# Patient Record
Sex: Female | Born: 1990 | Race: White | Hispanic: No | Marital: Single | Smoking: Current every day smoker
Health system: Southern US, Community
[De-identification: ages and names within clinical notes are randomized; demographics above are authoritative.]

## PROBLEM LIST (undated history)

## (undated) DIAGNOSIS — F17200 Nicotine dependence, unspecified, uncomplicated: Secondary | ICD-10-CM

## (undated) HISTORY — DX: Nicotine dependence, unspecified, uncomplicated: F17.200

---
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
## 2011-02-16 LAB — I-STAT 8, (EC8 V) (CONVERTED LAB)
Acid-base deficit: 1
BUN: 12
Bicarbonate: 24.8 — ABNORMAL HIGH
Chloride: 107
Glucose, Bld: 83
HCT: 41
Hemoglobin: 13.9
Operator id: 270651
Potassium: 3.8
Sodium: 141
TCO2: 26
pCO2, Ven: 43.8 — ABNORMAL LOW
pH, Ven: 7.361 — ABNORMAL HIGH

## 2011-02-16 LAB — CBC
HCT: 37.8
Hemoglobin: 13
MCHC: 34.4
MCV: 86.9
Platelets: 242
RBC: 4.35
RDW: 12.5
WBC: 5.7

## 2011-02-16 LAB — URINALYSIS, ROUTINE W REFLEX MICROSCOPIC
Bilirubin Urine: NEGATIVE
Glucose, UA: NEGATIVE
Hgb urine dipstick: NEGATIVE
Ketones, ur: NEGATIVE
Leukocytes, UA: NEGATIVE
Nitrite: NEGATIVE
Protein, ur: NEGATIVE
Specific Gravity, Urine: 1.028
Urobilinogen, UA: 1
pH: 7.5

## 2011-02-16 LAB — DIFFERENTIAL
Basophils Absolute: 0.1
Basophils Relative: 1
Eosinophils Absolute: 0.1
Eosinophils Relative: 3
Lymphocytes Relative: 39
Lymphs Abs: 2.2
Monocytes Absolute: 0.7
Monocytes Relative: 11 — ABNORMAL HIGH
Neutro Abs: 2.6
Neutrophils Relative %: 46

## 2011-02-16 LAB — URINE MICROSCOPIC-ADD ON

## 2011-02-16 LAB — POCT PREGNANCY, URINE
Operator id: 133351
Preg Test, Ur: NEGATIVE

## 2011-02-16 LAB — POCT I-STAT CREATININE
Creatinine, Ser: 0.7
Operator id: 270651

## 2011-02-25 ENCOUNTER — Ambulatory Visit (INDEPENDENT_AMBULATORY_CARE_PROVIDER_SITE_OTHER): Payer: Managed Care, Other (non HMO) | Admitting: Women's Health

## 2011-02-25 ENCOUNTER — Encounter: Payer: Self-pay | Admitting: Women's Health

## 2011-02-25 VITALS — BP 100/60 | Ht 64.5 in | Wt 112.0 lb

## 2011-02-25 DIAGNOSIS — Z23 Encounter for immunization: Secondary | ICD-10-CM

## 2011-02-25 DIAGNOSIS — F17200 Nicotine dependence, unspecified, uncomplicated: Secondary | ICD-10-CM

## 2011-02-25 DIAGNOSIS — Z01419 Encounter for gynecological examination (general) (routine) without abnormal findings: Secondary | ICD-10-CM

## 2011-02-25 DIAGNOSIS — Z309 Encounter for contraceptive management, unspecified: Secondary | ICD-10-CM

## 2011-02-25 DIAGNOSIS — IMO0001 Reserved for inherently not codable concepts without codable children: Secondary | ICD-10-CM

## 2011-02-25 DIAGNOSIS — D249 Benign neoplasm of unspecified breast: Secondary | ICD-10-CM

## 2011-02-25 DIAGNOSIS — Z113 Encounter for screening for infections with a predominantly sexual mode of transmission: Secondary | ICD-10-CM

## 2011-02-25 HISTORY — DX: Nicotine dependence, unspecified, uncomplicated: F17.200

## 2011-02-25 MED ORDER — NORETHIN ACE-ETH ESTRAD-FE 1-20 MG-MCG PO TABS: 1.0000 | ORAL_TABLET | Freq: Every day | ORAL | Status: DC

## 2011-02-25 NOTE — Patient Instructions (Signed)
Come back for gardasil in December and April ( For 2nd and 3rd)  QUIT smoking!!!!  Take out tongue ring

## 2011-02-25 NOTE — Progress Notes (Signed)
Carolyn Eaton [birth year] [id number]    History:    The patient presents for annual exam.    Past medical history, past surgical history, family history and social history were all reviewed and documented in the EPIC chart.   ROS:  A  ROS was performed and pertinent positives and negatives are included in the history.  Exam:  Filed Vitals:   02/25/11 1528  BP: 100/60    General appearance:  Normal Head/Neck:  Normal, without cervical or supraclavicular adenopathy. Thyroid:  Symmetrical, normal in size, without palpable masses or nodularity. Respiratory  Effort:  Normal  Auscultation:  Clear without wheezing or rhonchi Cardiovascular  Auscultation:  Regular rate, without rubs, murmurs or gallops  Edema/varicosities:  Not grossly evident Abdominal  Soft,nontender, without masses, guarding or rebound.  Liver/spleen:  No organomegaly noted  Hernia:  None appreciated  Skin  Inspection:  Grossly normal  Palpation:  Grossly normal Neurologic/psychiatric  Orientation:  Normal with appropriate conversation.  Mood/affect:  Normal  Genitourinary    Breasts: Examined lying and sitting.     Right: Without masses, retractions, discharge or axillary adenopathy.     Left: Without masses, retractions, discharge or axillary adenopathy.   Inguinal/mons:  Normal without inguinal adenopathy  External genitalia:  Normal  BUS/Urethra/Skene's glands:  Normal  Bladder:  Normal  Vagina:  Normal  Cervix:  Normal  Uterus:   normal in size, shape and contour.  Midline and mobile  Adnexa/parametria:     Rt: Without masses or tenderness.   Lt: Without masses or tenderness.  Anus and perineum: Normal  Digital rectal exam:   Assessment/Plan:  20 y.o.SWF G0  for annual exam monthly 5-6 day cycle. Not sexually active, used condoms and OCPs in past when active. Gardasil information was reviewed first given today. Is aware it is a 3 series vaccine. History of a fibroadenoma, had an ultrasound in  09 did not have followup will get  scheduled.  Normal GYN exam STD screen Smoker one pack per day  Plan: Contraception options were reviewed, Loestrin 1/20, prescription proper use, start up, condoms especially first month and for infection control were reviewed if becomes sexually active. Did review slight risk for blood clots, strokes.  Reviewed importance of no smoking. SBEs, exercise, calcium rich diet, MVI daily encouraged. CBC, UA, GC/Chlamydia, HIV, RPR, hepatitis B and C. Return to office in 2 and 6 months for second and third Gardasil.    Harrington Challenger [facility], 4:52 PM 02/25/2011

## 2011-02-26 LAB — HEPATITIS B SURFACE ANTIGEN: Hepatitis B Surface Ag: NEGATIVE

## 2011-02-26 LAB — HIV ANTIBODY (ROUTINE TESTING W REFLEX): HIV: NONREACTIVE

## 2011-02-26 LAB — RPR

## 2011-02-26 LAB — HEPATITIS C ANTIBODY: HCV Ab: NEGATIVE

## 2011-03-08 ENCOUNTER — Ambulatory Visit
Admission: RE | Admit: 2011-03-08 | Discharge: 2011-03-08 | Disposition: A | Discharge status: Home / Self Care | Payer: Managed Care, Other (non HMO) | Source: Ambulatory Visit | Attending: Women's Health | Admitting: Women's Health

## 2011-03-08 DIAGNOSIS — D249 Benign neoplasm of unspecified breast: Secondary | ICD-10-CM

## 2011-03-10 ENCOUNTER — Telehealth: Payer: Self-pay | Admitting: Women's Health

## 2011-03-10 NOTE — Telephone Encounter (Signed)
Telephone [place] to review ultrasound of left breast. History of fibroadenomas, they are stable, will continue SBEs and report changes.

## 2012-03-06 ENCOUNTER — Other Ambulatory Visit: Payer: Self-pay | Admitting: *Deleted

## 2012-03-06 ENCOUNTER — Encounter: Payer: Self-pay | Admitting: *Deleted

## 2012-03-06 DIAGNOSIS — IMO0001 Reserved for inherently not codable concepts without codable children: Secondary | ICD-10-CM

## 2012-03-06 MED ORDER — NORETHIN ACE-ETH ESTRAD-FE 1-20 MG-MCG PO TABS: 1.0000 | ORAL_TABLET | Freq: Every day | ORAL | Status: DC

## 2012-03-06 NOTE — Progress Notes (Signed)
Patient ID: Carolyn Eaton, female   DOB: 1991-02-16, 21 y.o.   MRN: [id number] Pt called requesting refill on her birth control pills, pt now lives in [place]. I told pt 2 pack can be sent, but she will need to find md in [place]. Pt gave # to [facility] ([phone]. Left message on pt voicemail that this # is not working.

## 2012-04-24 ENCOUNTER — Encounter: Payer: Self-pay | Admitting: Gynecology

## 2012-04-24 ENCOUNTER — Ambulatory Visit (INDEPENDENT_AMBULATORY_CARE_PROVIDER_SITE_OTHER): Payer: Managed Care, Other (non HMO) | Admitting: Gynecology

## 2012-04-24 ENCOUNTER — Other Ambulatory Visit (HOSPITAL_COMMUNITY)
Admission: RE | Admit: 2012-04-24 | Discharge: 2012-04-24 | Disposition: A | Discharge status: Home / Self Care | Payer: Managed Care, Other (non HMO) | Source: Ambulatory Visit | Attending: Gynecology | Admitting: Gynecology

## 2012-04-24 VITALS — BP 120/70 | Ht 64.5 in | Wt 118.0 lb

## 2012-04-24 DIAGNOSIS — Z113 Encounter for screening for infections with a predominantly sexual mode of transmission: Secondary | ICD-10-CM

## 2012-04-24 DIAGNOSIS — Z01419 Encounter for gynecological examination (general) (routine) without abnormal findings: Secondary | ICD-10-CM | POA: Insufficient documentation

## 2012-04-24 DIAGNOSIS — Z1151 Encounter for screening for human papillomavirus (HPV): Secondary | ICD-10-CM | POA: Insufficient documentation

## 2012-04-24 DIAGNOSIS — R87810 Cervical high risk human papillomavirus (HPV) DNA test positive: Secondary | ICD-10-CM | POA: Insufficient documentation

## 2012-04-24 DIAGNOSIS — Z23 Encounter for immunization: Secondary | ICD-10-CM

## 2012-04-24 DIAGNOSIS — IMO0001 Reserved for inherently not codable concepts without codable children: Secondary | ICD-10-CM

## 2012-04-24 DIAGNOSIS — Z309 Encounter for contraceptive management, unspecified: Secondary | ICD-10-CM

## 2012-04-24 LAB — CBC WITH DIFFERENTIAL/PLATELET
Basophils Absolute: 0.1 10*3/uL (ref 0.0–0.1)
Basophils Relative: 1 % (ref 0–1)
Eosinophils Absolute: 0.2 10*3/uL (ref 0.0–0.7)
Eosinophils Relative: 2 % (ref 0–5)
HCT: 44.1 % (ref 36.0–46.0)
Hemoglobin: 14.9 g/dL (ref 12.0–15.0)
Lymphocytes Relative: 38 % (ref 12–46)
Lymphs Abs: 3 10*3/uL (ref 0.7–4.0)
MCH: 30.3 pg (ref 26.0–34.0)
MCHC: 33.8 g/dL (ref 30.0–36.0)
MCV: 89.8 fL (ref 78.0–100.0)
Monocytes Absolute: 0.6 10*3/uL (ref 0.1–1.0)
Monocytes Relative: 7 % (ref 3–12)
Neutro Abs: 4.2 10*3/uL (ref 1.7–7.7)
Neutrophils Relative %: 52 % (ref 43–77)
Platelets: 315 10*3/uL (ref 150–400)
RBC: 4.91 MIL/uL (ref 3.87–5.11)
RDW: 13.3 % (ref 11.5–15.5)
WBC: 8 10*3/uL (ref 4.0–10.5)

## 2012-04-24 MED ORDER — NORETHIN ACE-ETH ESTRAD-FE 1-20 MG-MCG PO TABS: 1.0000 | ORAL_TABLET | Freq: Every day | ORAL | Status: DC

## 2012-04-24 NOTE — Patient Instructions (Addendum)
Smoking Cessation Quitting smoking is important to your health and has many advantages. However, it is not always easy to quit since nicotine is a very addictive drug. Often times, people try 3 times or more before being able to quit. This document explains the best ways for you to prepare to quit smoking. Quitting takes hard work and a lot of effort, but you can do it. ADVANTAGES OF QUITTING SMOKING  You will live longer, feel better, and live better.  Your body will feel the impact of quitting smoking almost immediately.  Within 20 minutes, blood pressure decreases. Your pulse returns to its normal level.  After 8 hours, carbon monoxide levels in the blood return to normal. Your oxygen level increases.  After 24 hours, the chance of having a heart attack starts to decrease. Your breath, hair, and body stop smelling like smoke.  After 48 hours, damaged nerve endings begin to recover. Your sense of taste and smell improve.  After 72 hours, the body is virtually free of nicotine. Your bronchial tubes relax and breathing becomes easier.  After 2 to 12 weeks, lungs can hold more air. Exercise becomes easier and circulation improves.  The risk of having a heart attack, stroke, cancer, or lung disease is greatly reduced.  After 1 year, the risk of coronary heart disease is cut in half.  After 5 years, the risk of stroke falls to the same as a nonsmoker.  After 10 years, the risk of lung cancer is cut in half and the risk of other cancers decreases significantly.  After 15 years, the risk of coronary heart disease drops, usually to the level of a nonsmoker.  If you are pregnant, quitting smoking will improve your chances of having a healthy baby.  The people you live with, especially any children, will be healthier.  You will have extra money to spend on things other than cigarettes. QUESTIONS TO THINK ABOUT BEFORE ATTEMPTING TO QUIT You may want to talk about your answers with your  caregiver.  Why do you want to quit?  If you tried to quit in the past, what helped and what did not?  What will be the most difficult situations for you after you quit? How will you plan to handle them?  Who can help you through the tough times? Your family? Friends? A caregiver?  What pleasures do you get from smoking? What ways can you still get pleasure if you quit? Here are some questions to ask your caregiver:  How can you help me to be successful at quitting?  What medicine do you think would be best for me and how should I take it?  What should I do if I need more help?  What is smoking withdrawal like? How can I get information on withdrawal? GET READY  Set a quit date.  Change your environment by getting rid of all cigarettes, ashtrays, matches, and lighters in your home, car, or work. Do not let people smoke in your home.  Review your past attempts to quit. Think about what worked and what did not. GET SUPPORT AND ENCOURAGEMENT You have a better chance of being successful if you have help. You can get support in many ways.  Tell your family, friends, and co-workers that you are going to quit and need their support. Ask them not to smoke around you.  Get individual, group, or telephone counseling and support. Programs are available at local hospitals and health centers. [place] your local health department for   information about programs in your area.  Spiritual beliefs and practices may help some smokers quit.  Download a "quit meter" on your computer to keep track of quit statistics, such as how long you have gone without smoking, cigarettes not smoked, and money saved.  Get a self-help book about quitting smoking and staying off of tobacco. LEARN NEW SKILLS AND BEHAVIORS  Distract yourself from urges to smoke. Talk to someone, go for a walk, or occupy your time with a task.  Change your normal routine. Take a different route to work. Drink tea instead of coffee.  Eat breakfast in a different place.  Reduce your stress. Take a hot bath, exercise, or read a book.  Plan something enjoyable to do every day. Reward yourself for not smoking.  Explore interactive web-based programs that specialize in helping you quit. GET MEDICINE AND USE IT CORRECTLY Medicines can help you stop smoking and decrease the urge to smoke. Combining medicine with the above behavioral methods and support can greatly increase your chances of successfully quitting smoking.  Nicotine replacement therapy helps deliver nicotine to your body without the negative effects and risks of smoking. Nicotine replacement therapy includes nicotine gum, lozenges, inhalers, nasal sprays, and skin patches. Some may be available over-the-counter and others require a prescription.  Antidepressant medicine helps people abstain from smoking, but how this works is unknown. This medicine is available by prescription.  Nicotinic receptor partial agonist medicine simulates the effect of nicotine in your brain. This medicine is available by prescription. Ask your caregiver for advice about which medicines to use and how to use them based on your health history. Your caregiver will tell you what side effects to look out for if you choose to be on a medicine or therapy. Carefully read the information on the package. Do not use any other product containing nicotine while using a nicotine replacement product.  RELAPSE OR DIFFICULT SITUATIONS Most relapses occur within the first 3 months after quitting. Do not be discouraged if you start smoking again. Remember, most people try several times before finally quitting. You may have symptoms of withdrawal because your body is used to nicotine. You may crave cigarettes, be irritable, feel very hungry, cough often, get headaches, or have difficulty concentrating. The withdrawal symptoms are only temporary. They are strongest when you first quit, but they will go away within  10 14 days. To reduce the chances of relapse, try to:  Avoid drinking alcohol. Drinking lowers your chances of successfully quitting.  Reduce the amount of caffeine you consume. Once you quit smoking, the amount of caffeine in your body increases and can give you symptoms, such as a rapid heartbeat, sweating, and anxiety.  Avoid smokers because they can make you want to smoke.  Do not let weight gain distract you. Many smokers will gain weight when they quit, usually less than 10 pounds. Eat a healthy diet and stay active. You can always lose the weight gained after you quit.  Find ways to improve your mood other than smoking. FOR MORE INFORMATION  www.smokefree.gov  Document Released: 04/19/2001 Document Revised: 10/25/2011 Document Reviewed: 08/04/2011 [employer] Patient Information 2013 [employer].  

## 2012-04-24 NOTE — Addendum Note (Signed)
Addended by: Bertram Savin A on: 04/24/2012 04:39 PM   Modules accepted: Orders

## 2012-04-24 NOTE — Progress Notes (Signed)
Carolyn Eaton [birth year] [id number]   History:    21 y.o.  for annual gyn exam with no complaints. Patient wanted prescription refill for oral contraceptive pill. She informed that the age of 64 she had the first Gardasil Vaccine and then last year at age 89 and did not followup. She will receive her third dose today. Patient with past history of left breast fibroadenomas noted and 2009 with a followup ultrasound 2012 which pathology reported as unchanged results as follows:  Ultrasound is performed, showing an oval gently lobulated  horizontally oriented well-defined solid mass at 3 o'clock 2 cm  from the left nipple measuring 1.5 x 1.6 x 0.9 cm. This is  unchanged from prior study.  There is a similar oval homogeneous well-defined solid mass at 1  o'clock 5 cm from the left nipple measuring 3.2 x 1.7 x 2.7 cm.  This is slightly larger than on prior exam but with an acceptable  range of growth for a fibroadenoma over the 3.5-year interval since  prior study.  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 No sonographic evidence of malignancy. Yearly  screening mammography should begin at age 70 unless clinically  indicated earlier.  Patient is having normal menstrual cycle is doing her monthly self breast examination. 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 Patient with no prior Pap smears. Patient is sexually active in a monogamous relationship.   Past medical history,surgical history, family history and social history were all reviewed and documented in the EPIC chart.  Gynecologic History Patient's last menstrual period was 03/29/2012. Contraception: OCP (estrogen/progesterone) Last Pap: No prior study. Results were: No prior study Last mammogram: Not indicated. Results were: Not indicated  Obstetric History OB History    Grav Para Term Preterm Abortions TAB SAB Ect Mult Living   0 0               ROS: A  ROS was performed and pertinent positives and negatives are included in the history.  GENERAL: No fevers or chills. HEENT: No change in vision, no earache, sore throat or sinus congestion. NECK: No pain or stiffness. CARDIOVASCULAR: No chest pain or pressure. No palpitations. PULMONARY: No shortness of breath, cough or wheeze. GASTROINTESTINAL: No abdominal pain, nausea, vomiting or diarrhea, melena or bright red blood per rectum. GENITOURINARY: No urinary frequency, urgency, hesitancy or dysuria. MUSCULOSKELETAL: No joint or muscle pain, no back pain, no recent trauma. DERMATOLOGIC: No rash, no itching, no lesions. ENDOCRINE: No polyuria, polydipsia, no heat or cold intolerance. No recent change in weight. HEMATOLOGICAL: No anemia or easy bruising or bleeding. NEUROLOGIC: No headache, seizures, numbness, tingling or weakness. PSYCHIATRIC: No depression, no loss of interest in normal activity or change in sleep pattern.     Exam: chaperone present  BP 120/70  Ht 5' 4.5" (1.638 m)  Wt 118 lb (53.524 kg)  BMI 19.94 kg/m2  LMP 03/29/2012  Body mass index is 19.94 kg/(m^2).  General appearance : Well developed well nourished female. No acute distress HEENT: Neck supple, trachea midline, no carotid bruits, no thyroidmegaly Lungs: Clear to auscultation, no rhonchi or wheezes, or rib retractions  Heart: Regular rate and rhythm, no murmurs or gallops Breast:Examined in sitting and supine position were symmetrical in appearance. A 1.5 cm nodule at the 3:00 position 2 cm from the nipple was noted also a second 3 cm nodule was noted the 1:00 position 5 cm from the nipple both on the  left breast.   Abdomen: no palpable masses or tenderness, no rebound or guarding Extremities: no edema or skin discoloration or tenderness  Pelvic:  Bartholin, Urethra, Skene Glands: Within normal limits             Vagina: No gross lesions or discharge  Cervix: No gross lesions or discharge  Uterus  anteverted, normal  size, shape and consistency, non-tender and mobile  Adnexa  Without masses or tenderness  Anus and perineum  normal   Rectovaginal  normal sphincter tone without palpated masses or tenderness             Hemoccult not indicated   Gardasil Vaccine #3 administered today  Assessment/Plan:  21 y.o. female for annual exam with history of fibroadenoma of the breast as described above. ( A 1.5 cm nodule at the 3:00 position 2 cm from the nipple was noted also a second 3 cm nodule was noted the 1:00 position 5 cm from the nipple both on the left breast).  Stable from previous study done in 2009. Patient was encouraged to continue to do her monthly self breast examination. First Pap smear done today with no HPV testing to follow the guidelines. A CBC and urinalysis was ordered today. The third and final Gardasil Vaccine dose was administered today. Patient to return back to the office in one year or when necessary. Of note GC and Chlamydia culture was obtained.    Ok Edwards MD, 4:16 PM 04/24/2012

## 2012-04-25 LAB — URINALYSIS W MICROSCOPIC + REFLEX CULTURE
Bacteria, UA: NONE SEEN
Bilirubin Urine: NEGATIVE
Casts: NONE SEEN
Crystals: NONE SEEN
Glucose, UA: NEGATIVE mg/dL
Hgb urine dipstick: NEGATIVE
Ketones, ur: NEGATIVE mg/dL
Leukocytes, UA: NEGATIVE
Nitrite: NEGATIVE
Protein, ur: NEGATIVE mg/dL
Specific Gravity, Urine: 1.005 (ref 1.005–1.030)
Squamous Epithelial / LPF: NONE SEEN
Urobilinogen, UA: 0.2 mg/dL (ref 0.0–1.0)
pH: 7 (ref 5.0–8.0)

## 2012-04-25 LAB — GC/CHLAMYDIA PROBE AMP
CT Probe RNA: NEGATIVE
GC Probe RNA: NEGATIVE

## 2012-05-16 ENCOUNTER — Telehealth: Payer: Self-pay | Admitting: *Deleted

## 2012-05-16 DIAGNOSIS — IMO0001 Reserved for inherently not codable concepts without codable children: Secondary | ICD-10-CM

## 2012-05-16 MED ORDER — NORETHIN ACE-ETH ESTRAD-FE 1-20 MG-MCG PO TABS: 1.0000 | ORAL_TABLET | Freq: Every day | ORAL | Status: DC

## 2012-05-16 NOTE — Telephone Encounter (Signed)
Pt called requesting birth control sent to pharmacy in [place]. Rx sent.

## 2013-04-26 ENCOUNTER — Telehealth: Payer: Self-pay | Admitting: *Deleted

## 2013-04-26 ENCOUNTER — Other Ambulatory Visit: Payer: Self-pay | Admitting: Gynecology

## 2013-04-26 NOTE — Telephone Encounter (Signed)
Pt called asking if 1 year Rx for birth control pills will be sent to pharmacy. I left message on her voicemail stating she is due for annual and will OV to receive 1 year rx.

## 2013-05-16 ENCOUNTER — Encounter: Payer: Self-pay | Admitting: Women's Health

## 2013-05-21 ENCOUNTER — Other Ambulatory Visit: Payer: Self-pay | Admitting: Gynecology

## 2013-06-05 ENCOUNTER — Encounter: Payer: Self-pay | Admitting: Women's Health

## 2013-06-05 ENCOUNTER — Other Ambulatory Visit (HOSPITAL_COMMUNITY)
Admission: RE | Admit: 2013-06-05 | Discharge: 2013-06-05 | Disposition: A | Discharge status: Home / Self Care | Payer: Managed Care, Other (non HMO) | Source: Ambulatory Visit | Attending: Gynecology | Admitting: Gynecology

## 2013-06-05 ENCOUNTER — Ambulatory Visit (INDEPENDENT_AMBULATORY_CARE_PROVIDER_SITE_OTHER): Payer: Managed Care, Other (non HMO) | Admitting: Women's Health

## 2013-06-05 VITALS — BP 104/62 | Ht 64.5 in | Wt 118.0 lb

## 2013-06-05 DIAGNOSIS — IMO0001 Reserved for inherently not codable concepts without codable children: Secondary | ICD-10-CM

## 2013-06-05 DIAGNOSIS — Z309 Encounter for contraceptive management, unspecified: Secondary | ICD-10-CM

## 2013-06-05 DIAGNOSIS — Z01419 Encounter for gynecological examination (general) (routine) without abnormal findings: Secondary | ICD-10-CM

## 2013-06-05 DIAGNOSIS — Z23 Encounter for immunization: Secondary | ICD-10-CM

## 2013-06-05 DIAGNOSIS — D249 Benign neoplasm of unspecified breast: Secondary | ICD-10-CM

## 2013-06-05 DIAGNOSIS — D242 Benign neoplasm of left breast: Secondary | ICD-10-CM | POA: Insufficient documentation

## 2013-06-05 MED ORDER — ETONOGESTREL-ETHINYL ESTRADIOL 0.12-0.015 MG/24HR VA RING: VAGINAL_RING | VAGINAL | Status: DC

## 2013-06-05 NOTE — Patient Instructions (Signed)

## 2013-06-05 NOTE — Progress Notes (Signed)
Carolyn Eaton [birth year] [id number]    History:    Presents for annual exam.  Light monthly cycle on Loestrin without complaint/same partner. 2 gardasil's in the past. Smoker less than half-pack daily. 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 Same partner. History of left breast fibroadenoma 3 cm at 12:00 position 1 cm at outer quadrant near the areola. Stable  3 ultrasounds.  Past medical history, past surgical history, family history and social history were all reviewed and documented in the EPIC chart. Glass blower/designer for [employer].  ROS:  A  ROS was performed and pertinent positives and negatives are included.  Exam:  Filed Vitals:   06/05/13 1035  BP: 104/62    General appearance:  Normal Thyroid:  Symmetrical, normal in size, without palpable masses or nodularity. Respiratory  Auscultation:  Clear without wheezing or rhonchi Cardiovascular  Auscultation:  Regular rate, without rubs, murmurs or gallops  Edema/varicosities:  Not grossly evident Abdominal  Soft,nontender, without masses, guarding or rebound.  Liver/spleen:  No organomegaly noted  Hernia:  None appreciated  Skin  Inspection:  Grossly normal   Breasts: Examined lying and sitting.     Right: 3 cm mobile firm fibroadenoma at 12:00 position, 1 cm mobile firm fibroadenoma outer quadrant near areola     Left: Without masses, retractions, discharge or axillary adenopathy. Gentitourinary   Inguinal/mons:  Normal without inguinal adenopathy  External genitalia:  Normal  BUS/Urethra/Skene's glands:  Normal  Vagina:  Normal  Cervix:  Normal  Uterus:   normal in size, shape and contour.  Midline and mobile  Adnexa/parametria:     Rt: Without masses or tenderness.   Lt: Without masses or tenderness.  Anus and perineum: Normal   Assessment/Plan:  23 y.o.SWF G0  for annual exam.    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 Reviewed importance of no smoking, aware of hazards. Third gardasil given today. SBE's, instructed to report any changes in SBE's. Continue regular exercise, calcium rich diet, MVI daily encouraged. CBC, UA, Pap.   Carolyn Eaton [facility], 12:09 PM 06/05/2013

## 2013-06-06 LAB — URINALYSIS W MICROSCOPIC + REFLEX CULTURE
Bacteria, UA: NONE SEEN
Bilirubin Urine: NEGATIVE
Casts: NONE SEEN
Crystals: NONE SEEN
Glucose, UA: NEGATIVE mg/dL
Hgb urine dipstick: NEGATIVE
Ketones, ur: NEGATIVE mg/dL
Leukocytes, UA: NEGATIVE
Nitrite: NEGATIVE
Protein, ur: NEGATIVE mg/dL
Specific Gravity, Urine: 1.01 (ref 1.005–1.030)
Squamous Epithelial / HPF: NONE SEEN
Urobilinogen, UA: 0.2 mg/dL (ref 0.0–1.0)
pH: 6 (ref 5.0–8.0)

## 2013-07-27 ENCOUNTER — Other Ambulatory Visit: Payer: Self-pay | Admitting: Gynecology

## 2013-07-31 ENCOUNTER — Telehealth: Payer: Self-pay | Admitting: *Deleted

## 2013-07-31 NOTE — Telephone Encounter (Signed)
(  pt aware you are out of the office) Pt called requesting to restart back on birth control pills, pt was prescribed nuvaring on 06/05/13. Pt said nuvaring is not working for her. Please advise

## 2013-08-04 NOTE — Telephone Encounter (Signed)
She had trouble remembering the pill, so ask if willing to try the patch?  Ortho evra patch 1 patch per week, low abd or hip (never breast area)  For 3 weeks,  Had annual in Jan. She could finish out current month on nuva ring and start patch when due to place next nuva ring. If this is not okay let me know and i will [place] her.

## 2013-08-05 NOTE — Telephone Encounter (Signed)
Left message for pt to [place].

## 2013-08-06 MED ORDER — NORELGESTROMIN-ETH ESTRADIOL 150-35 MCG/24HR TD PTWK: 1.0000 | MEDICATED_PATCH | TRANSDERMAL | Status: AC

## 2013-08-06 NOTE — Telephone Encounter (Signed)
Pt is willing to try patch, rx sent. Informed with all the below note
